# Patient Record
Sex: Female | Born: 1980 | Race: White | Hispanic: No | Marital: Married
Health system: Southern US, Community
[De-identification: ages and names within clinical notes are randomized; demographics above are authoritative.]

---

## 2016-02-13 ENCOUNTER — Other Ambulatory Visit (HOSPITAL_COMMUNITY): Payer: Self-pay | Admitting: Gastroenterology

## 2016-02-13 DIAGNOSIS — R1013 Epigastric pain: Secondary | ICD-10-CM

## 2016-02-18 ENCOUNTER — Encounter (HOSPITAL_COMMUNITY): Payer: Self-pay | Admitting: Radiology

## 2016-02-18 ENCOUNTER — Ambulatory Visit (HOSPITAL_COMMUNITY)
Admission: RE | Admit: 2016-02-18 | Discharge: 2016-02-18 | Disposition: A | Discharge status: Home / Self Care | Payer: BLUE CROSS/BLUE SHIELD | Source: Ambulatory Visit | Attending: Gastroenterology | Admitting: Gastroenterology

## 2016-02-18 DIAGNOSIS — R1013 Epigastric pain: Secondary | ICD-10-CM | POA: Insufficient documentation

## 2016-02-18 MED ORDER — TECHNETIUM TC 99M MEBROFENIN IV KIT
5.2000 | PACK | Freq: Once | INTRAVENOUS | Status: AC | PRN
  Administered 2016-02-18: 5.2 via INTRAVENOUS

## 2017-10-18 IMAGING — NM NM HEPATO W/GB/PHARM/[PERSON_NAME]
2 series · 12 of 12 positions shown · non-contrast
Comparison: None.

CLINICAL DATA: Abdominal pain, nausea and vomiting.

EXAM:
NUCLEAR MEDICINE HEPATOBILIARY IMAGING WITH GALLBLADDER EF
TECHNIQUE: Sequential images of the abdomen were obtained [DATE] minutes
following intravenous administration of radiopharmaceutical. After
oral ingestion of 8 ounces of Ensure Complete, gallbladder ejection
fraction was determined.
RADIOPHARMACEUTICALS:  5.2 mCi Vc-HHm Choletec IV

[he hepatobiliary · 3.43mm/px · 6 of 60 frames shown (1 of 2)]
[frame 6/60]
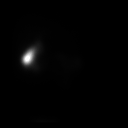
[frame 16/60]
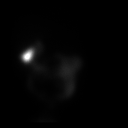
[frame 26/60]
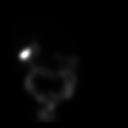
[frame 36/60]
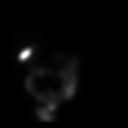
[frame 46/60]
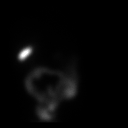
[frame 56/60]
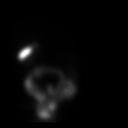

[he hepatobiliary · 3.43mm/px · 6 of 56 frames shown (2 of 2)]
[frame 5/56]
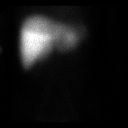
[frame 14/56]
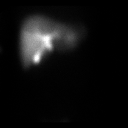
[frame 24/56]
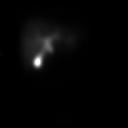
[frame 33/56]
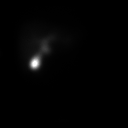
[frame 42/56]
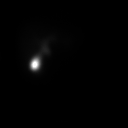
[frame 52/56]
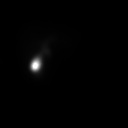

[12 of 12 positions shown; findings below may reference images not displayed]

FINDINGS: Prompt uptake and biliary excretion of activity by the liver is
seen. Gallbladder activity is visualized, consistent with patency of
cystic duct. Biliary activity passes into small bowel, consistent
with patent common bile duct.

Calculated gallbladder ejection fraction is 73%. (Normal gallbladder
ejection fraction with half-and-half is greater than 33%.)
IMPRESSION: Normal hepatobiliary nuclear medicine study demonstrating no
evidence of biliary obstruction or cholecystitis. Calculated
gallbladder ejection fraction of 73% is normal.

## 2018-02-28 ENCOUNTER — Other Ambulatory Visit: Payer: Self-pay | Admitting: Gastroenterology

## 2018-02-28 DIAGNOSIS — R1011 Right upper quadrant pain: Secondary | ICD-10-CM

## 2020-08-10 ENCOUNTER — Telehealth: Payer: Self-pay | Admitting: Internal Medicine

## 2020-08-10 NOTE — Telephone Encounter (Signed)
Having 10-15 + watery stools w/o blood, fever or pain  No nausea or vomiting  No recent Abx  Was having IBS mixed pattern of stools w/ more constipation 2 weeks ago told to try probiotic and increase fluids'  Shortly after trhat more stools and then 4-5 d ago wayery diarrhea round the clock  Spoke to Dr. Loreta Ave and was told try loperamide and hydrate and to call back if not better 24-48 hrs - so she is calling back  Told to hydrate w/ electrolyte drinks and to call office in AM, to ED if needed overnight

## 2023-02-03 ENCOUNTER — Other Ambulatory Visit (HOSPITAL_COMMUNITY): Payer: Self-pay | Admitting: Gastroenterology

## 2023-02-03 DIAGNOSIS — R1033 Periumbilical pain: Secondary | ICD-10-CM

## 2023-02-07 ENCOUNTER — Encounter (HOSPITAL_COMMUNITY): Payer: Self-pay

## 2023-02-07 ENCOUNTER — Ambulatory Visit (HOSPITAL_COMMUNITY): Payer: BC Managed Care – PPO

## 2024-02-03 ENCOUNTER — Encounter: Payer: Self-pay | Admitting: *Deleted
